# Patient Record
Sex: Female | Born: 1980 | Race: Black or African American | Hispanic: No | Marital: Single
Health system: Southern US, Community
[De-identification: ages and names within clinical notes are randomized; demographics above are authoritative.]

---
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
## 2003-04-19 ENCOUNTER — Encounter: Payer: Self-pay | Admitting: Emergency Medicine

## 2003-04-19 ENCOUNTER — Emergency Department (HOSPITAL_COMMUNITY): Admission: EM | Admit: 2003-04-19 | Discharge: 2003-04-19 | Payer: Self-pay | Admitting: Emergency Medicine

## 2004-03-08 ENCOUNTER — Emergency Department (HOSPITAL_COMMUNITY): Admission: EM | Admit: 2004-03-08 | Discharge: 2004-03-08 | Payer: Self-pay | Admitting: Family Medicine

## 2004-10-27 ENCOUNTER — Encounter (INDEPENDENT_AMBULATORY_CARE_PROVIDER_SITE_OTHER): Payer: Self-pay | Admitting: Specialist

## 2004-10-27 ENCOUNTER — Ambulatory Visit (HOSPITAL_COMMUNITY): Admission: AD | Admit: 2004-10-27 | Discharge: 2004-10-27 | Payer: Self-pay | Admitting: Obstetrics & Gynecology

## 2004-11-15 ENCOUNTER — Other Ambulatory Visit: Admission: RE | Admit: 2004-11-15 | Discharge: 2004-11-15 | Payer: Self-pay | Admitting: Obstetrics and Gynecology

## 2005-08-14 ENCOUNTER — Emergency Department (HOSPITAL_COMMUNITY): Admission: EM | Admit: 2005-08-14 | Discharge: 2005-08-14 | Payer: Self-pay | Admitting: Emergency Medicine

## 2005-10-09 ENCOUNTER — Emergency Department (HOSPITAL_COMMUNITY): Admission: EM | Admit: 2005-10-09 | Discharge: 2005-10-09 | Payer: Self-pay | Admitting: Emergency Medicine

## 2006-02-27 IMAGING — US US OB COMP LESS 14 WK
1 series · 14 of 17 positions shown · non-contrast
Comparison: None.

CLINICAL DATA: Pelvic cramping and vaginal bleeding at 14 weeks and 5 days of
pregnancy by last menstrual period.

COMPLETE OBSTETRIC ULTRASOUND LESS THAN 14 WEEKS
TECHNIQUE: Transabdominal sonographic imaging of the pelvis was performed.

[Series 1: us ob comp less 14 wk · 0.22mm/px · 14 of 17 slices shown]
[im 1/17]
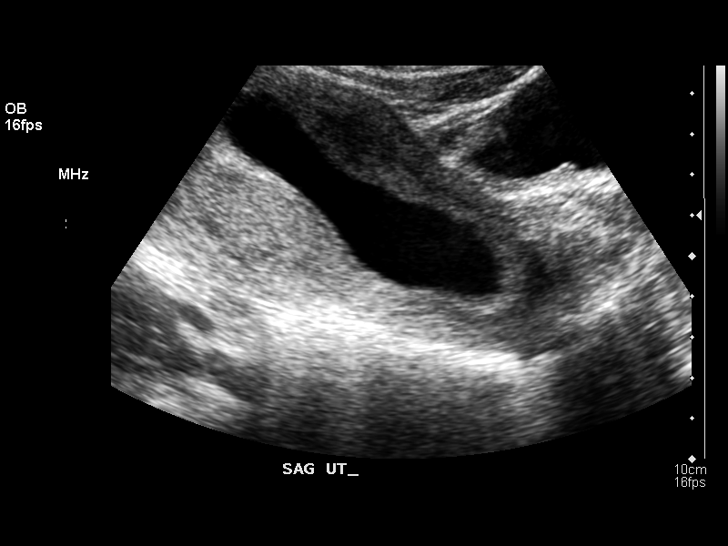
[im 2/17]
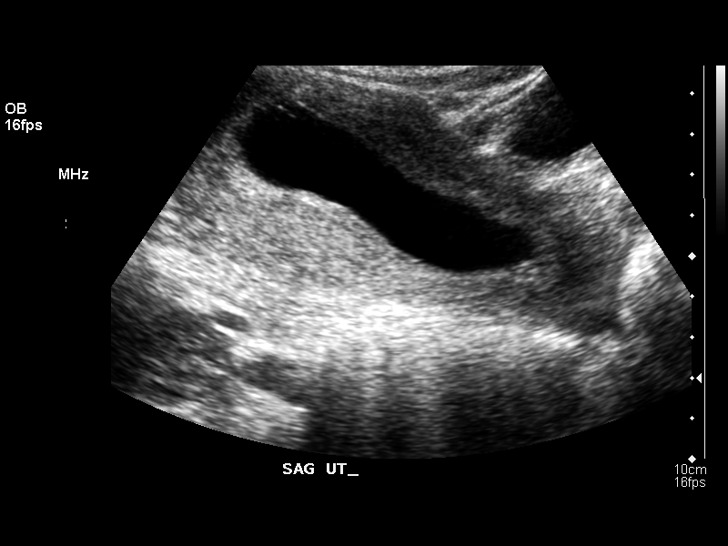
[im 4/17]
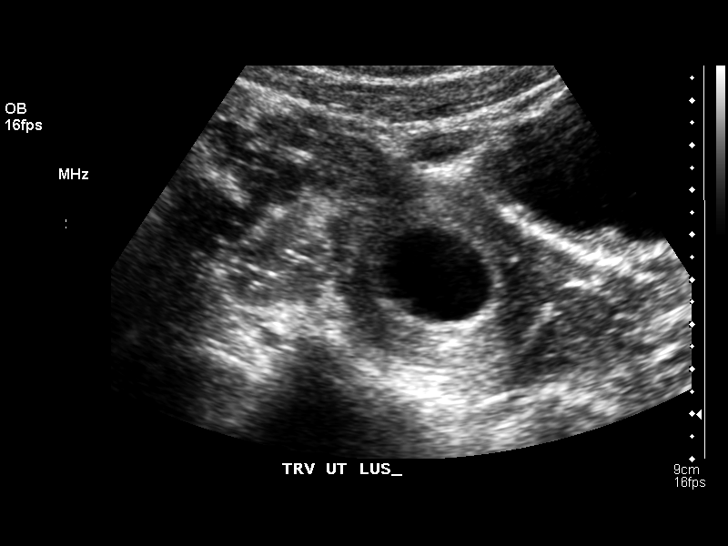
[im 5/17]
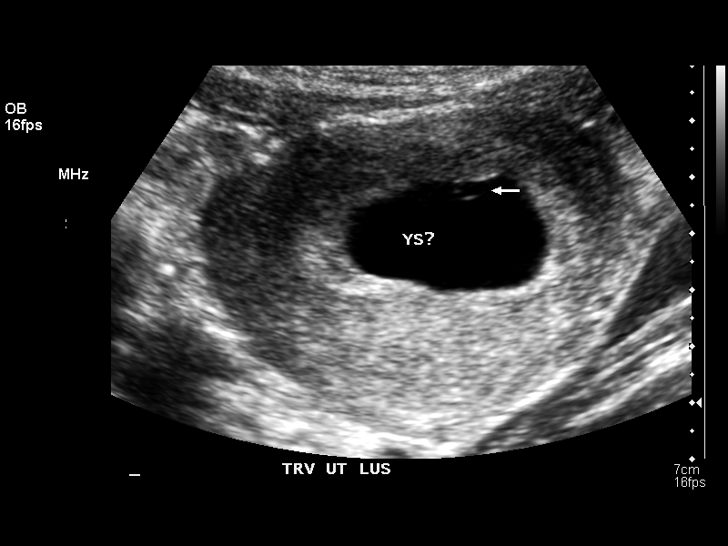
[im 6/17]
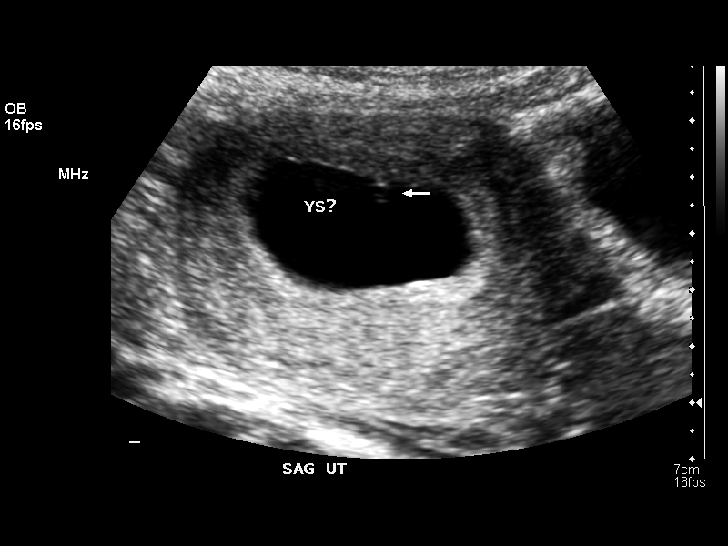
[im 7/17]
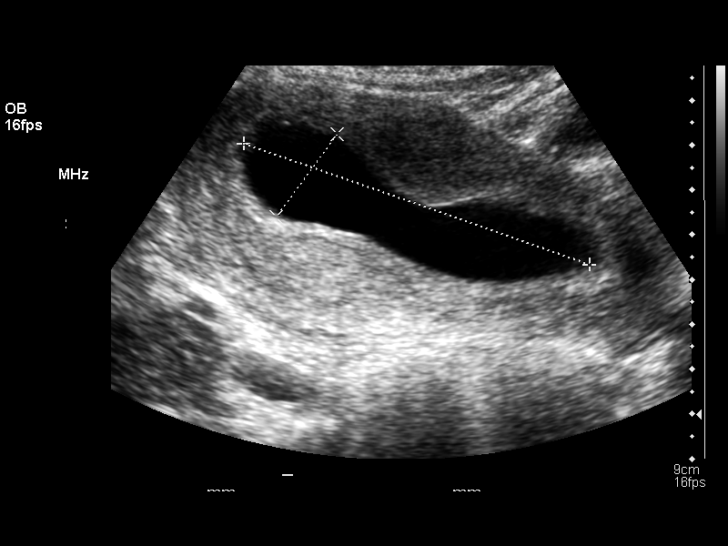
[im 8/17]
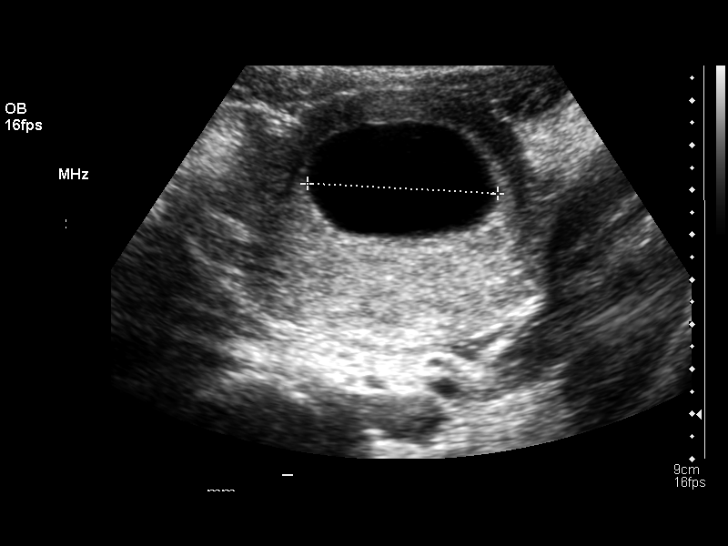
[im 10/17]
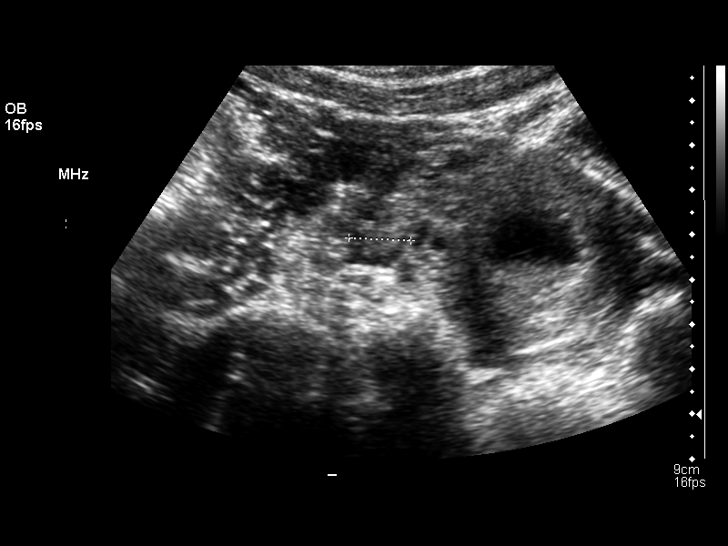
[im 11/17]
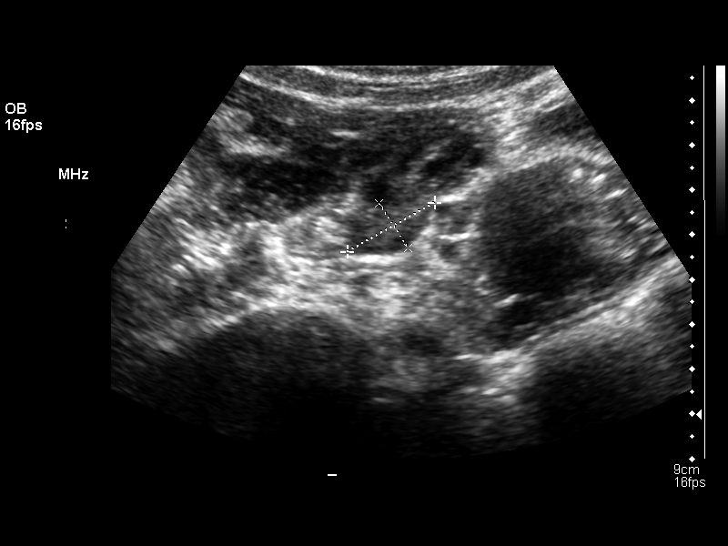
[im 12/17]
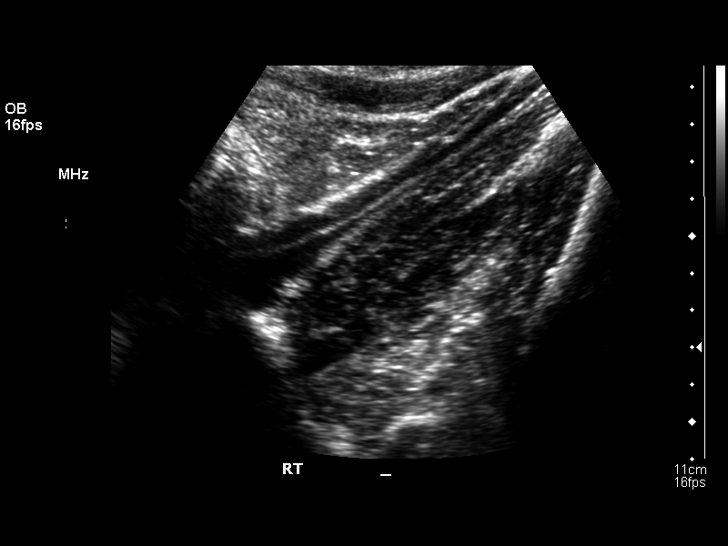
[im 13/17]
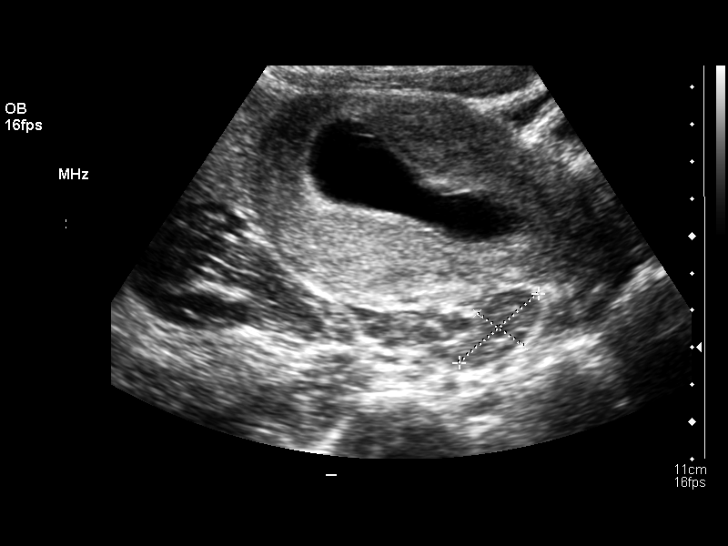
[im 14/17]
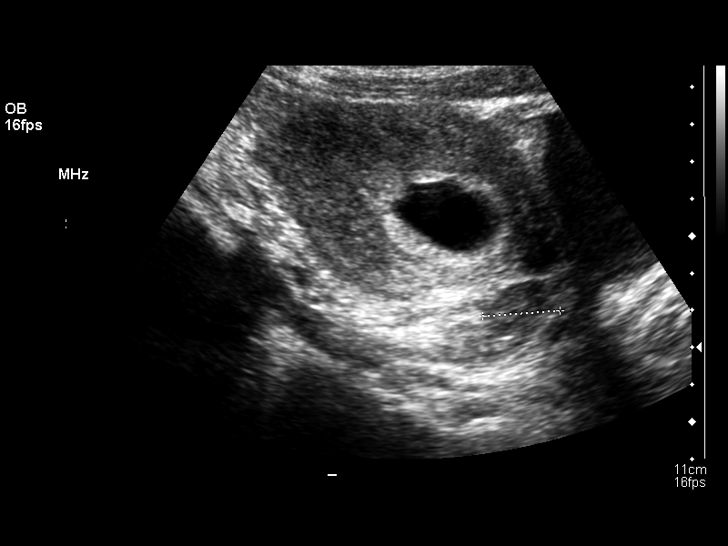
[im 16/17]
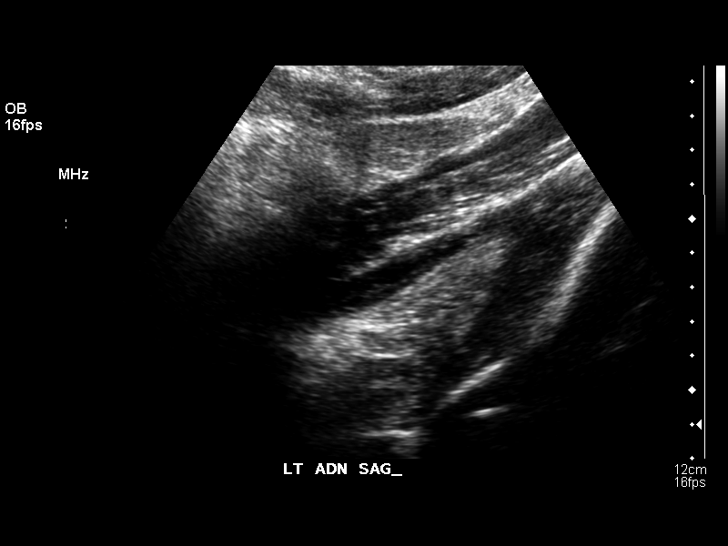
[im 17/17]
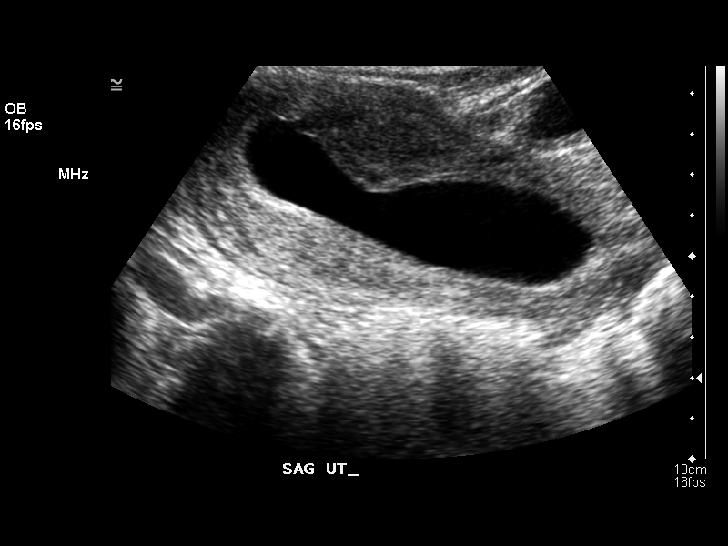

[14 of 17 positions shown; findings below may reference images not displayed]

FINDINGS: Deformed, elongated intrauterine gestational sac with a mean sac
diameter of 49.1 mm. This gives an estimated gestational age of 10 weeks and 4
days. Small, poorly defined probable yolk sac anteriorly. No fetal pole seen.
Normal appearing ovaries with no adnexal masses seen. No free peritoneal fluid.

IMPRESSION

Spontaneous abortion.

## 2006-11-15 ENCOUNTER — Inpatient Hospital Stay (HOSPITAL_COMMUNITY): Admission: AD | Admit: 2006-11-15 | Discharge: 2006-11-15 | Payer: Self-pay | Admitting: Family Medicine

## 2008-03-17 IMAGING — US US PELVIS COMPLETE
1 series · 14 of 25 positions shown · non-contrast
Comparison: none

CLINICAL DATA: 25-year-old with heavy bleeding. Status post TAB two weeks ago at 13 weeks gestation. Quantitative beta HCG is 574.  
 TRANSABDOMINAL AND TRANSVAGINAL PELVIC ULTRASOUND:
TECHNIQUE: Both transabdominal and endovaginal ultrasound examinations of the pelvis were performed including evaluation of the uterus, ovaries, adnexal regions, and pelvic cul-de-sac.

[Series 1: us pelvis complete · 0.20mm/px · 14 of 30 slices shown]
[im 1/30]
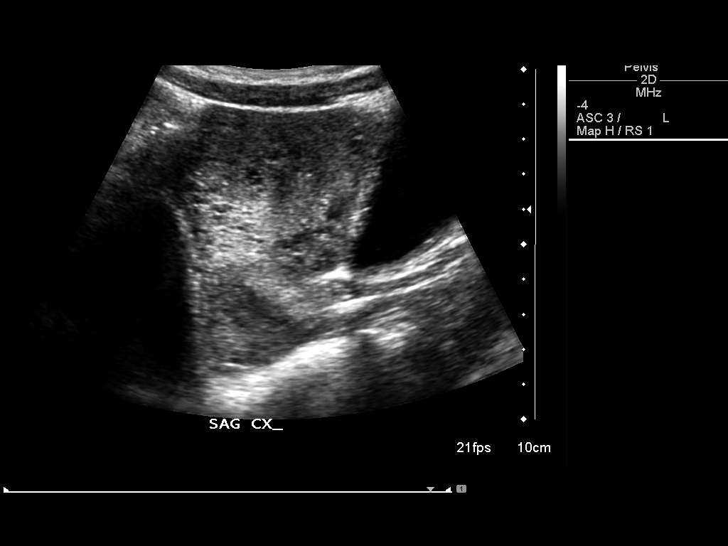
[im 3/30]
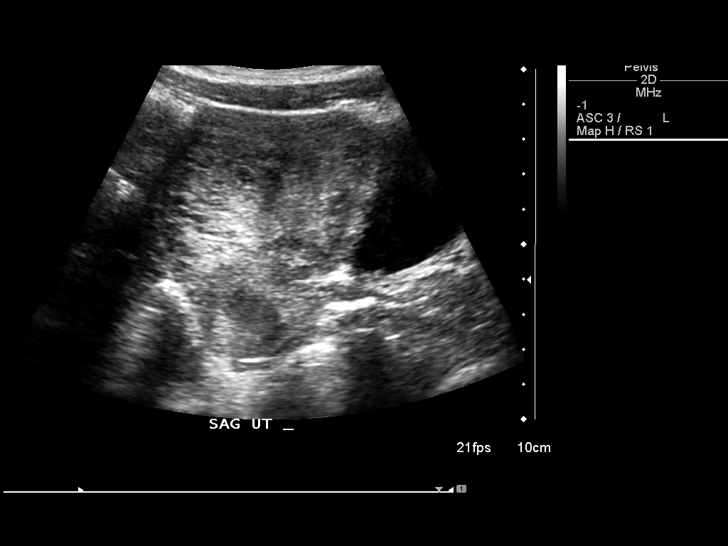
[im 5/30]
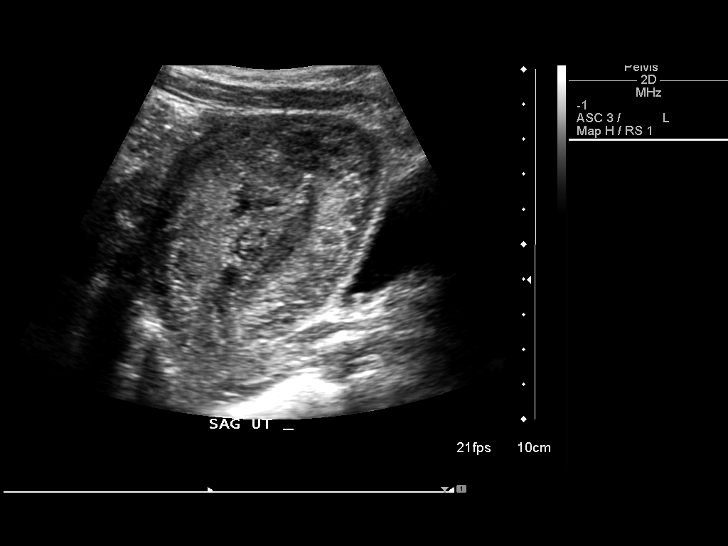
[im 8/30]
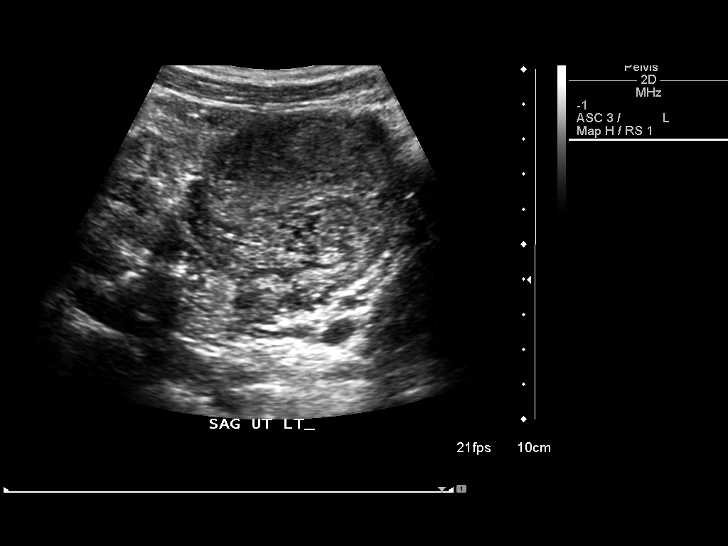
[im 10/30]
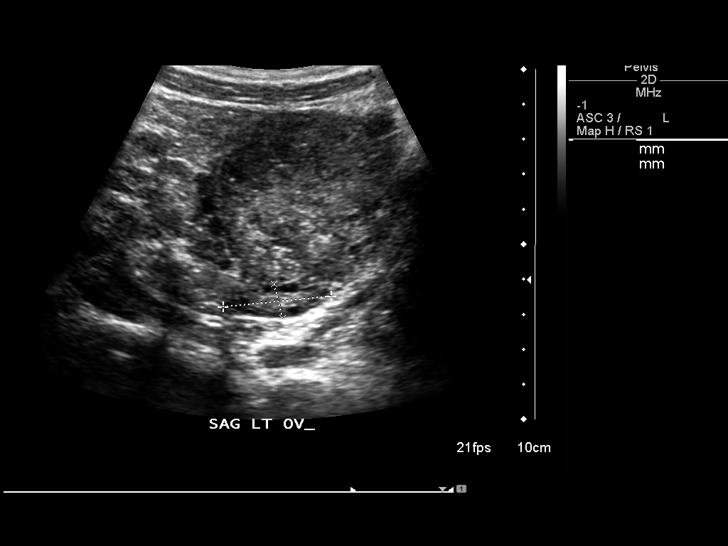
[im 11/30]
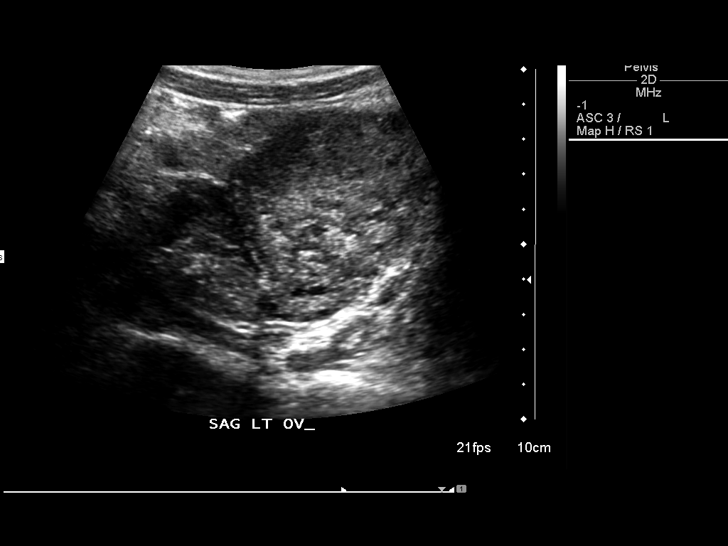
[im 14/30]
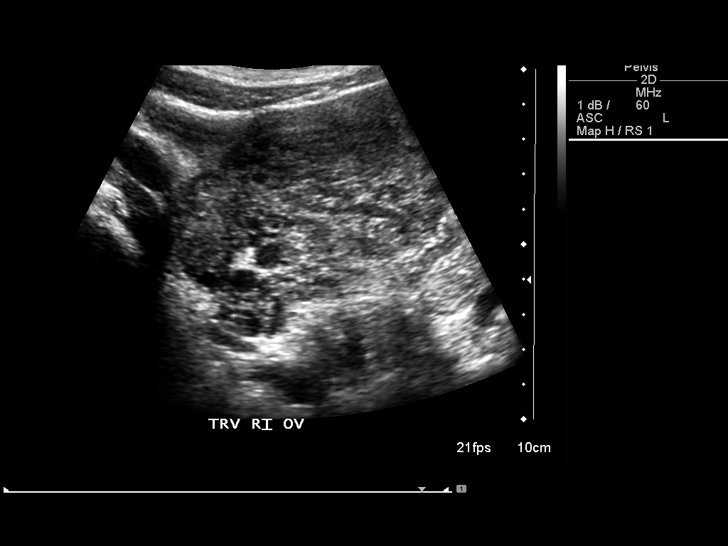
[im 16/30]
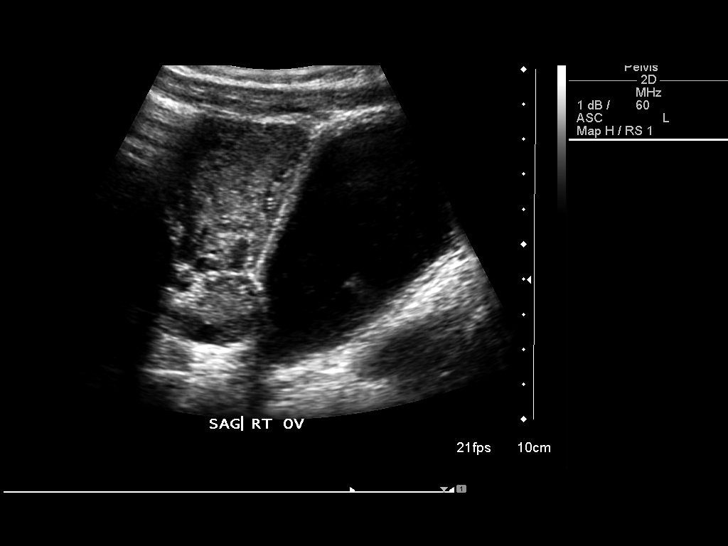
[im 19/30]
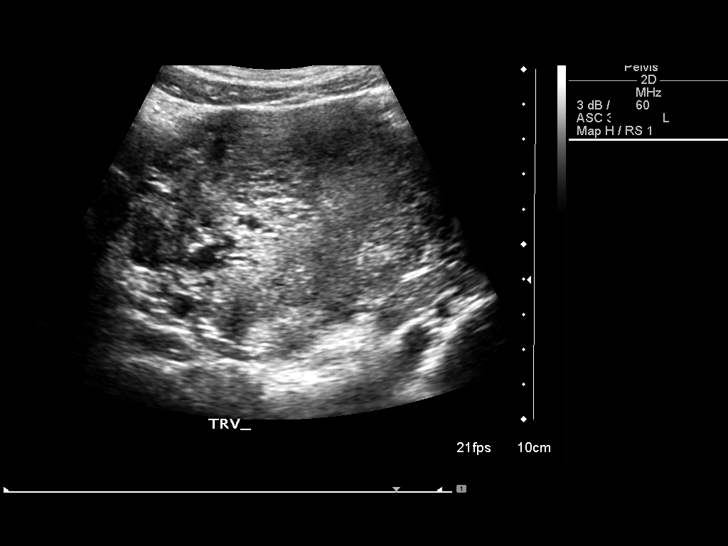
[im 20/30]
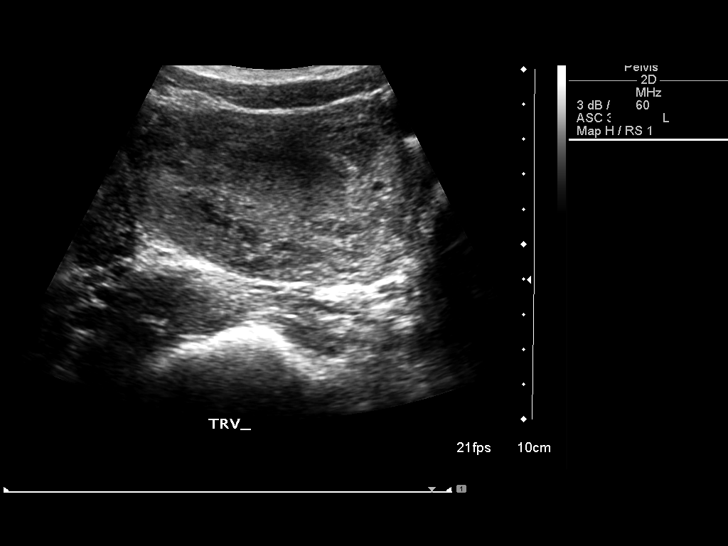
[im 22/30]
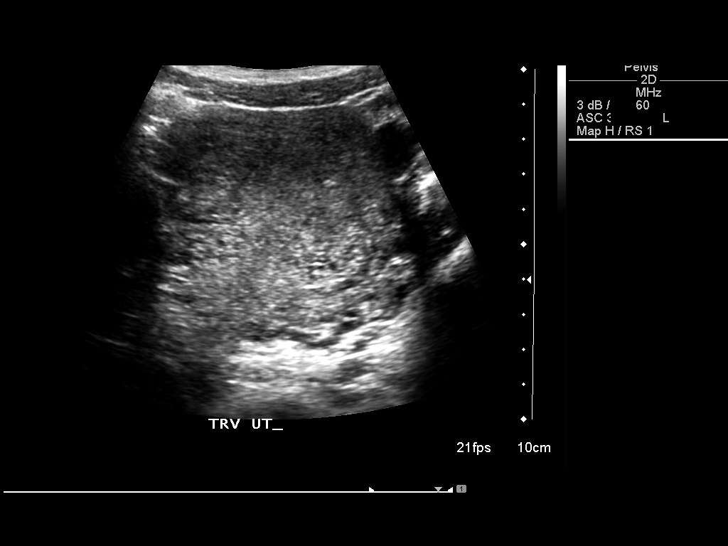
[im 25/30]
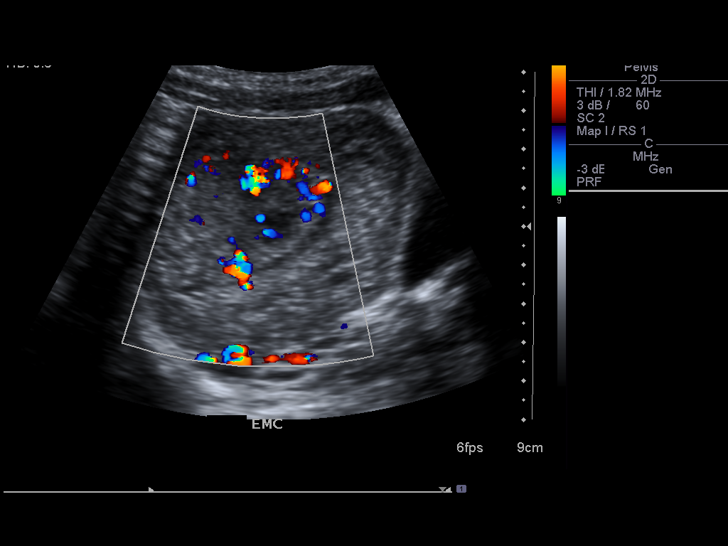
[im 27/30]
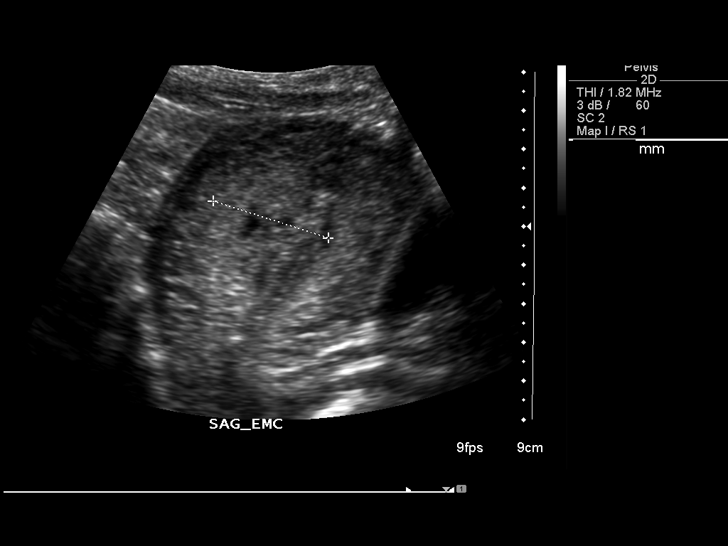
[im 30/30]
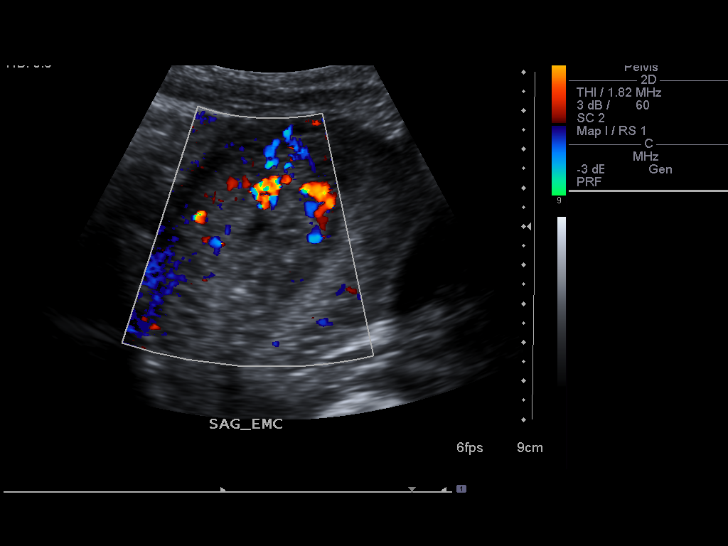

[14 of 25 positions shown; findings below may reference images not displayed]

FINDINGS: The uterus is 7.5 x 5.8 x 7.3 cm. The endometrium appears thickened and heterogeneous with presence of blood flow on Doppler evaluation. The findings are consistent with retained products of conception. The endometrial stripe measures 3.1 cm. 
 Right ovary is 2.6 x 1.7 x 2.1 cm and has normal appearance. Left ovary is 3.1 x 1.0 x 1.4 cm and also has a normal appearance.
IMPRESSION: Findings are consistent with retained products of conception.

## 2010-02-27 ENCOUNTER — Ambulatory Visit (HOSPITAL_COMMUNITY): Admission: RE | Admit: 2010-02-27 | Discharge: 2010-02-27 | Payer: Self-pay | Admitting: Obstetrics & Gynecology

## 2010-03-07 ENCOUNTER — Ambulatory Visit: Payer: Self-pay | Admitting: Family Medicine

## 2010-04-29 ENCOUNTER — Ambulatory Visit: Payer: Self-pay | Admitting: Internal Medicine

## 2010-04-29 DIAGNOSIS — R079 Chest pain, unspecified: Secondary | ICD-10-CM | POA: Insufficient documentation

## 2010-04-29 DIAGNOSIS — R002 Palpitations: Secondary | ICD-10-CM | POA: Insufficient documentation

## 2010-05-20 ENCOUNTER — Telehealth (INDEPENDENT_AMBULATORY_CARE_PROVIDER_SITE_OTHER): Payer: Self-pay | Admitting: *Deleted

## 2010-07-26 ENCOUNTER — Ambulatory Visit: Payer: Self-pay | Admitting: Family Medicine

## 2010-07-27 ENCOUNTER — Inpatient Hospital Stay (HOSPITAL_COMMUNITY)
Admission: AD | Admit: 2010-07-27 | Discharge: 2010-07-29 | Payer: Self-pay | Source: Home / Self Care | Attending: Obstetrics & Gynecology | Admitting: Obstetrics & Gynecology

## 2010-08-02 ENCOUNTER — Ambulatory Visit: Payer: Self-pay | Admitting: Obstetrics & Gynecology

## 2010-09-19 NOTE — Assessment & Plan Note (Signed)
Summary: np6/tachycardia/24 weeks   Visit Type:  Initial Consult  CC:  chest pain and rapid heart rate.  History of Present Illness: Patient is a 30 year old who is [redacted] wks pregnant.  She is referred for evaluation of chest pains and palpitiatons.  The episodes of chest pain have occurred for 5 to 6 years.  They are intermittent but appear to be worse with pregnancy.    The patin she describes usually occur 1 x per wk.  They are L sided.  Happen when she takes sudden movements though occasionally at rest.  Last from 10 seconds to minutes.  She is afraid to take breath.  Not associated with other physical activities. She also has episodes of feeling her heart race.  began with pregnancy.  Notes increased rated with some diziziness.  Did have episode of syncope with this 3 wks ago.  Was dizzy with spell earlier today. Says she may not be drinking enough.  Urine often darker yellow.  Current Medications (verified): 1)  Pre-Natal Formula  Tabs (Prenatal Multivit-Min-Fe-Fa) .... Take One Daily  Allergies: No Known Drug Allergies  Past History:  Past medical, surgical, family and social histories (including risk factors) reviewed, and no changes noted (except as noted below).  Past Medical History: Reviewed history from 04/29/2010 and no changes required. lightheadedness asthma palpitations  Family History: Reviewed history from 04/29/2010 and no changes required. Mom deceased died  age 59 with DVT (leg started in leg -went to heart )-unknown cause Brother-diabetes sister -hiogh cholestrol mom and brother / schizphrenia  Social History: Reviewed history from 04/29/2010 and no changes required.  non-smoker, , no drug abuse, alcohol use  Review of Systems       All systems reviewed.  Neg to the above problem except as noted above.  Vital Signs:  Patient profile:   30 year old female Height:      64 inches Weight:      139 pounds Pulse rate:   104 / minute Pulse (ortho):   105  / minute Pulse rhythm:   regular BP sitting:   100 / 60  (right arm) BP standing:   100 / 65  Vitals Entered By: Jacquelin Hawking, CMA (April 29, 2010 2:04 PM)  Serial Vital Signs/Assessments:  Time      Position  BP       Pulse  Resp  Temp     By 2:59 PM   Lying RA  98/66    103                   Concetta Pfohl, CMA 2:59 PM   Sitting   108/68   102                   Concetta Pfohl, CMA 2:59 PM   Standing  100/65   105                   Concetta Pfohl, CMA  Comments: 2:59 PM 3 min.  105/72   95   lightheaded while lying and sitting felt better standing. By: Jacquelin Hawking, CMA    Physical Exam  Additional Exam:  Patient is in NAD HEENT:  Normocephalic, atraumatic. EOMI, PERRLA.  Neck: JVP is normal. No thyromegaly. No bruits.  Lungs: clear to auscultation. No rales no wheezes.  Heart: Regular rate and rhythm. Normal S1, S2. No S3.   No significant murmurs. PMI not displaced.  Abdomen:  Distended, nontender. Normal bowel  sounds. . No hepatomegaly.  Extremities:   Good distal pulses throughout. No lower extremity edema.  Musculoskeletal :moving all extremities.  Neuro:   alert and oriented x3.    EKG  Procedure date:  04/29/2010  Findings:      NSR.  99 bpm.  Impression & Recommendations:  Problem # 1:  CHEST PAIN UNSPECIFIED (ICD-786.50) I do not think the patient's chest pains are cardiac.  She has had for years.  very atypical.  Probable  musculoskeletal.   I would not pursue furhter testing for now.    Problem # 2:  PALPITATIONS (ICD-785.1) Not sure if this a signif arrhythmia.  Concerning with syncope.  MAy indeed be orthostatic hypotension with ST.  She is not orthostatic today though.  I recommended that she increase her fluid and salt intake.  I would also set her up for an event monitor.    Other Orders: EKG w/ Interpretation (93000) Event (Event)  Patient Instructions: 1)  Your physician has recommended that you wear an event monitor.  Event monitors  are medical devices that record the heart's electrical activity. Doctors most often use these monitors to diagnose arrhythmias. Arrhythmias are problems with the speed or rhythm of the heartbeat. The monitor is a small, portable device. You can wear one while you do your normal daily activities. This is usually used to diagnose what is causing palpitations/syncope (passing out).   Appended Document: np6/tachycardia/24 weeks LMOM with normal event monitor strips and to remember to stay well hydrated and to call if symptoms change.

## 2010-09-19 NOTE — Progress Notes (Signed)
  Records faxed to [facility] Dept @ [phone] attn.L Cleotis Lema Mesiemore  May 20, 2010 10:28 AM

## 2010-10-28 LAB — CBC
HCT: 40.2 % (ref 36.0–46.0)
MCH: 30.8 pg (ref 26.0–34.0)
MCV: 93.8 fL (ref 78.0–100.0)
Platelets: 302 10*3/uL (ref 150–400)
RBC: 4.29 MIL/uL (ref 3.87–5.11)
RDW: 14.1 % (ref 11.5–15.5)

## 2010-10-28 LAB — WET PREP, GENITAL: Yeast Wet Prep HPF POC: NONE SEEN

## 2010-11-02 LAB — POCT URINALYSIS DIP (DEVICE)
Bilirubin Urine: NEGATIVE
Hgb urine dipstick: NEGATIVE
Ketones, ur: NEGATIVE mg/dL
Protein, ur: NEGATIVE mg/dL
pH: 5.5 (ref 5.0–8.0)

## 2011-01-03 NOTE — Op Note (Signed)
[name], [name]                ACCOUNT NO.:  [id number]   MEDICAL RECORD NO.:  [id number]          PATIENT TYPE:  MAT   LOCATION:  [facility]                          FACILITY:  [facility]   PHYSICIAN:  Huel Cote, M.D. DATE OF BIRTH:  Jul 31, 1981   DATE OF PROCEDURE:  10/27/2004  DATE OF DISCHARGE:                                 OPERATIVE REPORT   PREOP DIAGNOSIS:  Incomplete abortion at 10+ weeks.   POSTOP DIAGNOSIS:  Incomplete abortion at 10+ weeks.   PROCEDURE:  Suction D&E.   SURGEON:  Dr. Huel Cote.   ANESTHESIA:  MAC.   FINDINGS:  Uterus was 9 to 10 weeks in size with positive products of  conception noted at os. There is a moderate amount of tissue removed both  manually and with the suction.   ESTIMATED BLOOD LOSS:  200 mL.   PROCEDURE:  The patient was taken to the operating room where MAC anesthesia  was obtained without difficulty. She was then prepped and draped in the  normal sterile fashion in dorsal lithotomy position. A speculum was then  placed within the vagina and cervix identified with a large amount of tissue  protruding through the cervical os. This was grasped with ring forceps and  removed, although the tissue did break apart and there was clearly tissue  left in the uterine fundus. At this point the 9 mm suction curette was  easily introduced into the uterus with no dilation required and the suction  applied and a moderate amount of products of conception removed on several  passes. When no further tissue was forthcoming suction was discontinued and  sharp curettage was performed in the uterine cavity with a gritty surface  noted in all quadrants and no further tissue removed. At this point the  procedure was discontinued. Tenaculum removed from the cervix and no active  bleeding was noted. All instruments and sponges were counted and counts were  correct and the patient was awakened and taken to the recovery room in  stable condition.      KR/MEDQ  D:  10/27/2004  T:  10/28/2004  Job:  [id number]

## 2016-05-13 ENCOUNTER — Encounter (INDEPENDENT_AMBULATORY_CARE_PROVIDER_SITE_OTHER): Payer: Self-pay

## 2017-10-23 NOTE — Progress Notes (Signed)
error
